# Patient Record
Sex: Male | Born: 1980 | Race: White | Hispanic: No | Marital: Single | Smoking: Current every day smoker
Health system: Southern US, Community
[De-identification: ages and names within clinical notes are randomized; demographics above are authoritative.]

## PROBLEM LIST (undated history)

## (undated) HISTORY — PX: NEPHRECTOMY: SHX65

---

## 2014-08-28 ENCOUNTER — Emergency Department (HOSPITAL_BASED_OUTPATIENT_CLINIC_OR_DEPARTMENT_OTHER): Payer: Self-pay

## 2014-08-28 ENCOUNTER — Emergency Department (HOSPITAL_BASED_OUTPATIENT_CLINIC_OR_DEPARTMENT_OTHER)
Admission: EM | Admit: 2014-08-28 | Discharge: 2014-08-28 | Disposition: A | Discharge status: Home / Self Care | Payer: Self-pay | Attending: Emergency Medicine | Admitting: Emergency Medicine

## 2014-08-28 ENCOUNTER — Encounter (HOSPITAL_BASED_OUTPATIENT_CLINIC_OR_DEPARTMENT_OTHER): Payer: Self-pay | Admitting: Emergency Medicine

## 2014-08-28 DIAGNOSIS — Z720 Tobacco use: Secondary | ICD-10-CM | POA: Insufficient documentation

## 2014-08-28 DIAGNOSIS — Z79899 Other long term (current) drug therapy: Secondary | ICD-10-CM | POA: Insufficient documentation

## 2014-08-28 DIAGNOSIS — Z880 Allergy status to penicillin: Secondary | ICD-10-CM | POA: Insufficient documentation

## 2014-08-28 DIAGNOSIS — R059 Cough, unspecified: Secondary | ICD-10-CM

## 2014-08-28 DIAGNOSIS — R05 Cough: Secondary | ICD-10-CM | POA: Insufficient documentation

## 2014-08-28 MED ORDER — HYDROCOD POLST-CHLORPHEN POLST 10-8 MG/5ML PO LQCR
5.0000 mL | Freq: Two times a day (BID) | ORAL | Status: AC | PRN
Start: 2014-08-28 — End: ?

## 2014-08-28 NOTE — ED Provider Notes (Signed)
CSN: [id number]     Arrival date & time 08/28/14  1820 History   First MD Initiated Contact with Patient 08/28/14 1950     Chief Complaint  Patient presents with  . Cough     (Consider location/radiation/quality/duration/timing/severity/associated sxs/prior Treatment) Patient is a 33 y.o. male presenting with cough. The history is provided by the patient and medical records.  Cough   This is a [age] year old male with no significant past medical history presenting to the ED for cough for the past week. Patient states cough is dry and "hackey".  He denies any fever or chills. No chest pain or shortness of breath. Patient was recently helping take care of his mother who was sick with bronchitis. Patient is a daily smoker, but has not smoked any cigarettes today. He has been taking over-the-counter Mucinex DM without noted improvement of symptoms.  History reviewed. No pertinent past medical history. Past Surgical History  Procedure Laterality Date  . 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 History  Substance Use Topics  . Smoking status: Current Every Day Smoker  . Smokeless tobacco: Not on file  . Alcohol Use: Not on file    Review of Systems  Respiratory: Positive for cough.   All other systems reviewed and are negative.     Allergies  Penicillins and Sulfa antibiotics  Home Medications   Prior to Admission medications   Medication Sig Start Date End Date Taking? Authorizing Provider  omeprazole (PRILOSEC) 20 MG capsule Take 20 mg by mouth daily.   Yes Historical Provider, MD   BP 124/85 mmHg  Pulse 91  Temp(Src) 98.6 F (37 C) (Oral)  Resp 24  Ht 6\' 4"  (1.93 m)  Wt 300 lb (136.079 kg)  BMI 36.53 kg/m2  SpO2 96%   Physical Exam  Constitutional: He is oriented to person, place, and time. He appears well-developed and well-nourished. No distress.  HENT:  Head: Normocephalic and atraumatic.  Right Ear: Tympanic membrane and ear canal normal.   Left Ear: Tympanic membrane and ear canal normal.  Nose: Nose normal.  Mouth/Throat: Uvula is midline, oropharynx is clear and moist and mucous membranes are normal. No oropharyngeal exudate, posterior oropharyngeal edema, posterior oropharyngeal erythema or tonsillar abscesses.  Eyes: Conjunctivae and EOM are normal. Pupils are equal, round, and reactive to light.  Neck: Normal range of motion. Neck supple.  Cardiovascular: Normal rate, regular rhythm and normal heart sounds.   Pulmonary/Chest: Effort normal and breath sounds normal. No respiratory distress. He has no wheezes. He has no rhonchi.  Abdominal: Soft. Bowel sounds are normal. There is no tenderness. There is no guarding.  Musculoskeletal: Normal range of motion. He exhibits no edema.  Neurological: He is alert and oriented to person, place, and time.  Skin: Skin is warm and dry. He is not diaphoretic.  Psychiatric: He has a normal mood and affect.  Nursing note and vitals reviewed.   ED Course  Procedures (including critical care time) Labs Review Labs Reviewed - No data to display  Imaging Review Dg Chest 2 View  08/28/2014   CLINICAL DATA:  Cough for 1 week  EXAM: CHEST  2 VIEW  COMPARISON:  None.  FINDINGS: Cardiac shadow is within normal limits. The lungs are well aerated bilaterally. Minimal atelectatic changes are noted in the right middle lobe. No effusion is seen. No other focal abnormality is noted.  IMPRESSION: Mild atelectasis in the right middle lobe.   Electronically  Signed   By: Alcide CleverMark  Lukens M.D.   On: 08/28/2014 18:41     EKG Interpretation None      MDM   Final diagnoses:  Cough   33 year old male with cough for one week after exposure to mother with bronchitis.  No chest pain or SOB.  On exam, patient afebrile and nontoxic in appearance. His respirations are unlabored and lungs sounds are clear. Vital signs stable on room air. Chest x-ray was obtained which is negative for acute findings. Patient  with likely viral URI/bronchitis.  Encouraged supportive care at home, tussionex for cough.  Encouraged to stop smoking.  Patient will FU with PCP.  Discussed plan with patient, he/she acknowledged understanding and agreed with plan of care.  Return precautions given for new or worsening symptoms.  Garlon HatchetLisa M Athan Casalino, PA-C 08/28/14 2325  Geoffery Lyonsouglas Delo, MD 08/29/14 [phone]

## 2014-08-28 NOTE — Discharge Instructions (Signed)
Take the prescribed medication as directed. °Follow-up with your primary care physician. °Return to the ED for new or worsening symptoms. ° °

## 2014-08-28 NOTE — ED Notes (Signed)
Pt presents to ED with complaints of cough for a week

## 2016-05-08 IMAGING — CR DG CHEST 2V
2 series · 2 of 2 positions shown · non-contrast
Comparison: None.

CLINICAL DATA: Cough for 1 week

EXAM:
CHEST  2 VIEW

[w chest pa]
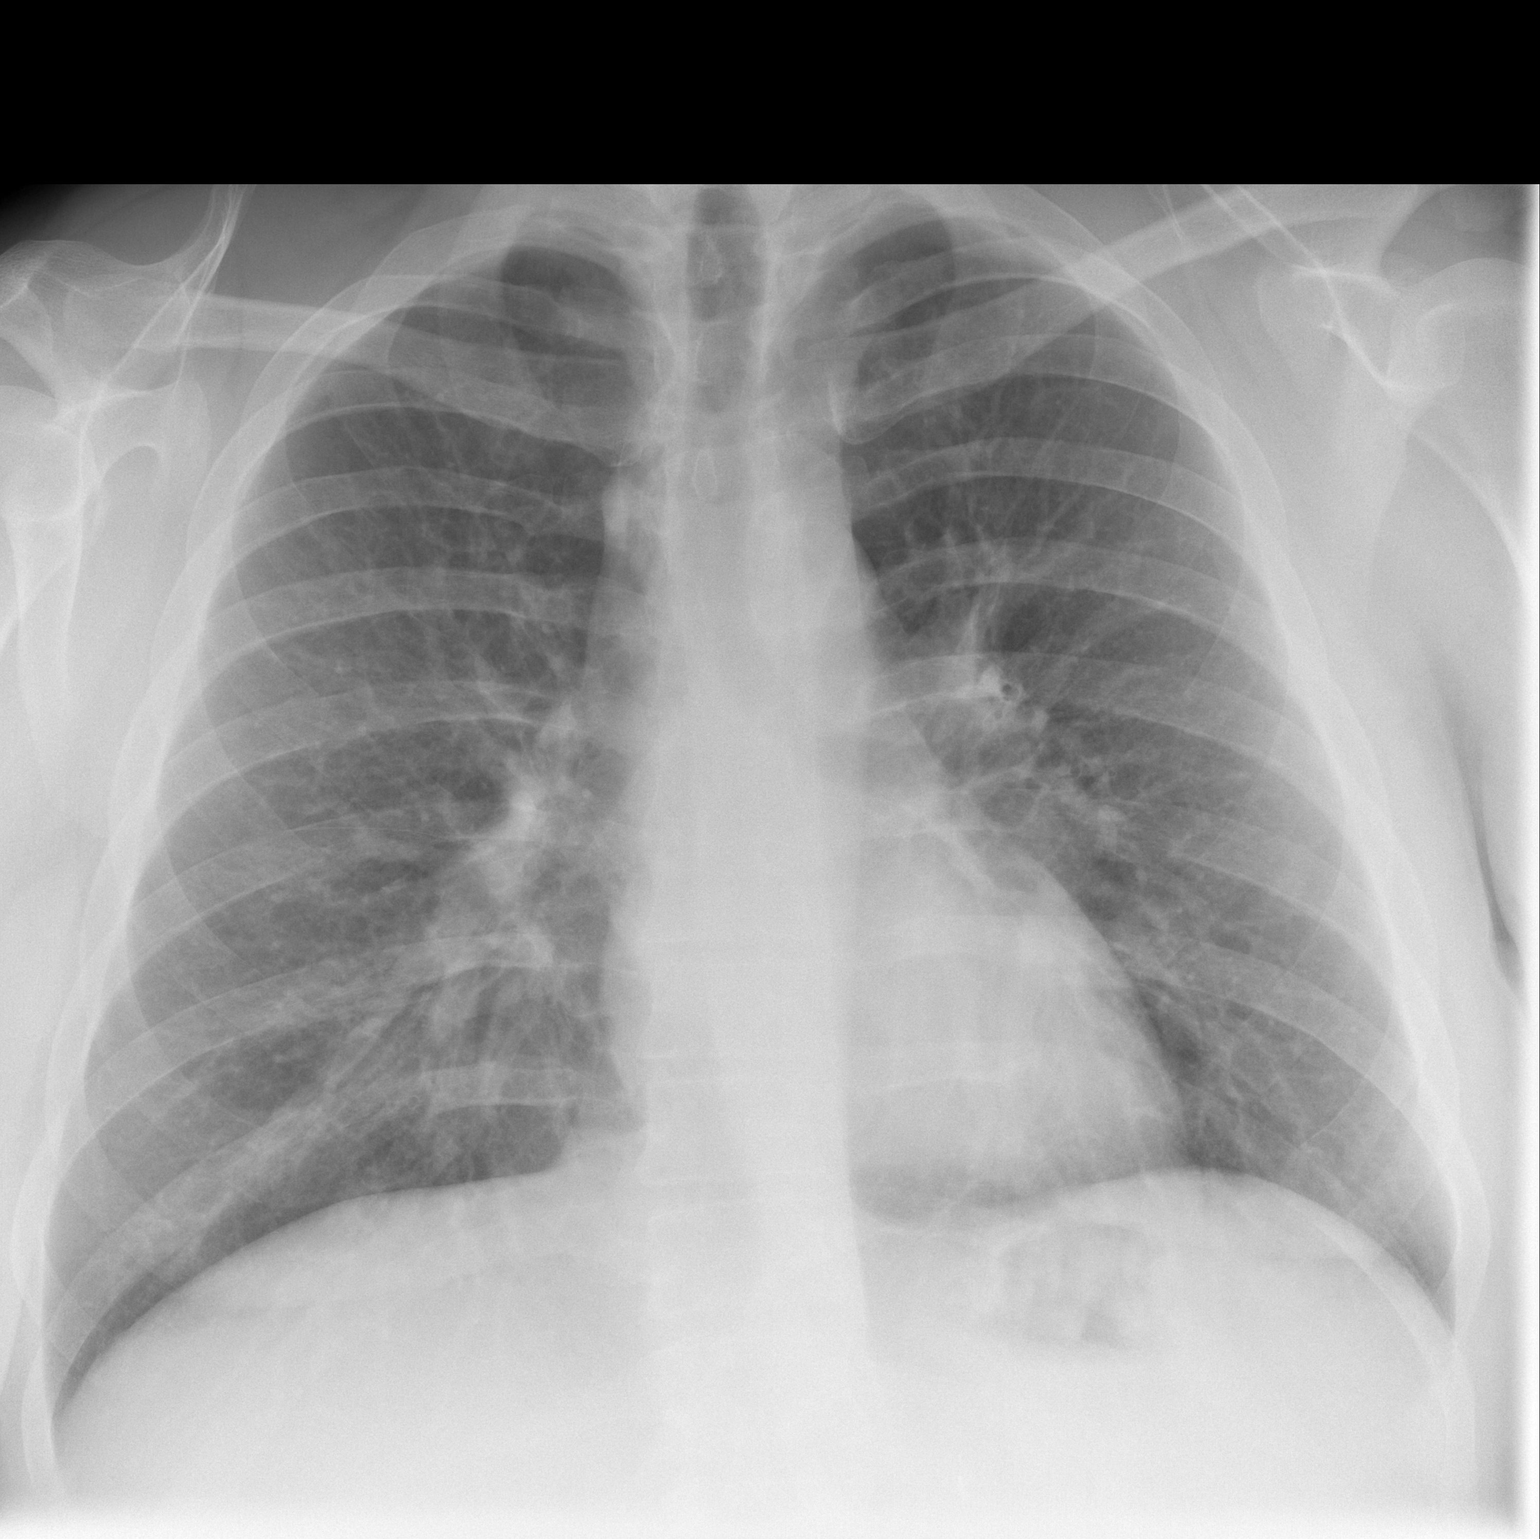

[w chest lat]
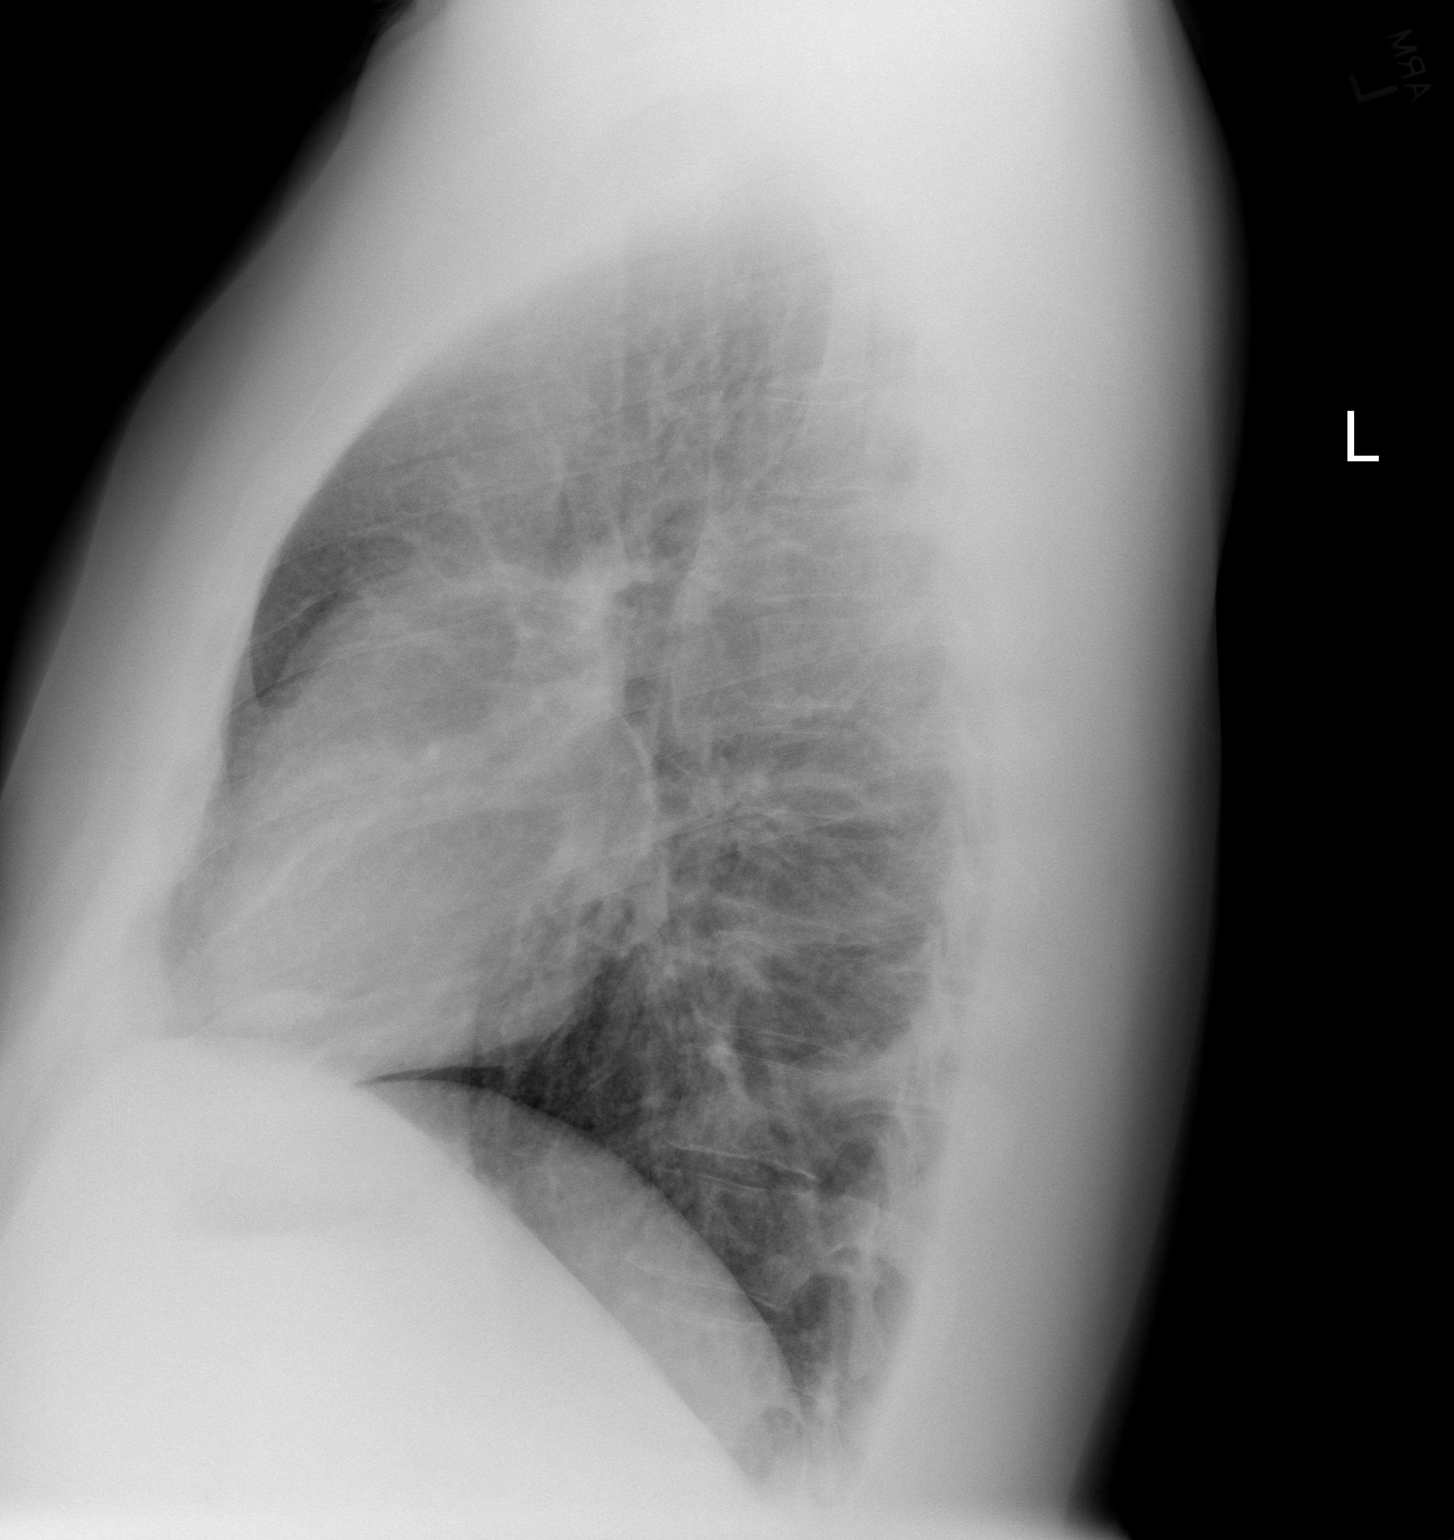

[2 of 2 positions shown; findings below may reference images not displayed]

FINDINGS: Cardiac shadow is within normal limits. The lungs are well aerated
bilaterally. Minimal atelectatic changes are noted in the right
middle lobe. No effusion is seen. No other focal abnormality is
noted.
IMPRESSION: Mild atelectasis in the right middle lobe.
